# Patient Record
Sex: Male | Born: 1960 | Race: White | Hispanic: No | Smoking: Never smoker
Health system: Southern US, Community
[De-identification: ages and names within clinical notes are randomized; demographics above are authoritative.]

## PROBLEM LIST (undated history)

## (undated) DIAGNOSIS — K219 Gastro-esophageal reflux disease without esophagitis: Secondary | ICD-10-CM

## (undated) DIAGNOSIS — T7840XA Allergy, unspecified, initial encounter: Secondary | ICD-10-CM

## (undated) DIAGNOSIS — K5792 Diverticulitis of intestine, part unspecified, without perforation or abscess without bleeding: Secondary | ICD-10-CM

## (undated) DIAGNOSIS — B019 Varicella without complication: Secondary | ICD-10-CM

## (undated) HISTORY — DX: Allergy, unspecified, initial encounter: T78.40XA

## (undated) HISTORY — DX: Gastro-esophageal reflux disease without esophagitis: K21.9

## (undated) HISTORY — DX: Diverticulitis of intestine, part unspecified, without perforation or abscess without bleeding: K57.92

## (undated) HISTORY — DX: Varicella without complication: B01.9

---
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
## 2016-10-24 ENCOUNTER — Emergency Department (HOSPITAL_BASED_OUTPATIENT_CLINIC_OR_DEPARTMENT_OTHER): Payer: BLUE CROSS/BLUE SHIELD

## 2016-10-24 ENCOUNTER — Emergency Department (HOSPITAL_BASED_OUTPATIENT_CLINIC_OR_DEPARTMENT_OTHER)
Admission: EM | Admit: 2016-10-24 | Discharge: 2016-10-24 | Disposition: A | Discharge status: Home / Self Care | Payer: BLUE CROSS/BLUE SHIELD | Attending: Emergency Medicine | Admitting: Emergency Medicine

## 2016-10-24 ENCOUNTER — Encounter (HOSPITAL_BASED_OUTPATIENT_CLINIC_OR_DEPARTMENT_OTHER): Payer: Self-pay | Admitting: Emergency Medicine

## 2016-10-24 DIAGNOSIS — B349 Viral infection, unspecified: Secondary | ICD-10-CM | POA: Insufficient documentation

## 2016-10-24 DIAGNOSIS — Z23 Encounter for immunization: Secondary | ICD-10-CM | POA: Diagnosis not present

## 2016-10-24 DIAGNOSIS — J40 Bronchitis, not specified as acute or chronic: Secondary | ICD-10-CM | POA: Diagnosis not present

## 2016-10-24 DIAGNOSIS — R55 Syncope and collapse: Secondary | ICD-10-CM

## 2016-10-24 DIAGNOSIS — J208 Acute bronchitis due to other specified organisms: Secondary | ICD-10-CM

## 2016-10-24 LAB — URINALYSIS, ROUTINE W REFLEX MICROSCOPIC
BILIRUBIN URINE: NEGATIVE
Glucose, UA: NEGATIVE mg/dL
HGB URINE DIPSTICK: NEGATIVE
KETONES UR: 15 mg/dL — AB
Leukocytes, UA: NEGATIVE
NITRITE: NEGATIVE
PROTEIN: NEGATIVE mg/dL
Specific Gravity, Urine: 1.025 (ref 1.005–1.030)
pH: 6 (ref 5.0–8.0)

## 2016-10-24 LAB — BASIC METABOLIC PANEL
Anion gap: 9 (ref 5–15)
BUN: 17 mg/dL (ref 6–20)
CALCIUM: 9.3 mg/dL (ref 8.9–10.3)
CO2: 26 mmol/L (ref 22–32)
CREATININE: 1.08 mg/dL (ref 0.61–1.24)
Chloride: 101 mmol/L (ref 101–111)
GFR calc Af Amer: >60 mL/min (ref >60)
GLUCOSE: 107 mg/dL — AB (ref 65–99)
Potassium: 4.3 mmol/L (ref 3.5–5.1)
SODIUM: 136 mmol/L (ref 135–145)

## 2016-10-24 LAB — CBC
HCT: 48.5 % (ref 39.0–52.0)
Hemoglobin: 16.5 g/dL (ref 13.0–17.0)
MCH: 30.7 pg (ref 26.0–34.0)
MCHC: 34 g/dL (ref 30.0–36.0)
MCV: 90.3 fL (ref 78.0–100.0)
PLATELETS: 160 10*3/uL (ref 150–400)
RBC: 5.37 MIL/uL (ref 4.22–5.81)
RDW: 12.9 % (ref 11.5–15.5)
WBC: 7 10*3/uL (ref 4.0–10.5)

## 2016-10-24 LAB — CBG MONITORING, ED: GLUCOSE-CAPILLARY: 96 mg/dL (ref 65–99)

## 2016-10-24 LAB — TROPONIN I

## 2016-10-24 MED ORDER — SODIUM CHLORIDE 0.9 % IV BOLUS (SEPSIS)
1000.0000 mL | Freq: Once | INTRAVENOUS | Status: AC
  Administered 2016-10-24: 1000 mL via INTRAVENOUS

## 2016-10-24 MED ORDER — TETANUS-DIPHTH-ACELL PERTUSSIS 5-2.5-18.5 LF-MCG/0.5 IM SUSP
0.5000 mL | Freq: Once | INTRAMUSCULAR | Status: AC
  Administered 2016-10-24: 0.5 mL via INTRAMUSCULAR
  Filled 2016-10-24: qty 0.5

## 2016-10-24 NOTE — ED Notes (Signed)
Delay in xray, RN starting IV 

## 2016-10-24 NOTE — ED Triage Notes (Signed)
Patient states that he has had generalized muscle aches and a cough x 1 week. He went to the urgent care and while waiting he passed out face first. He now would like to be checked for the cough / cold, head injury and nasal bridge injury

## 2016-10-24 NOTE — ED Provider Notes (Signed)
[facility] Provider Note   CSN: [id number] Arrival date & time: 10/24/16  1222     History   Chief Complaint Chief Complaint  Patient presents with  . Loss of Consciousness    HPI John Ryan is a 56 y.o. male.  HPI 56 year old male who presents with syncopal episode. He reports 3-4 days of cough, congestion, generalized body ache.Has had low-grade temperatures but no chills or night sweats. Denies any nausea, vomiting, diarrhea, dysuria or urinary frequency. Today went to urgent care for evaluation. States that he tried to get up to use the restroom, feeling very dizzy and nauseated. While ambulating towards the bathroom he had loss of consciousness. Denies any associating chest pain or shortness of breath, confusion, focal numbness or weakness vision or speech changes. He is not anticoagulated.   History reviewed. No pertinent past medical history.  There are no active problems to display for this patient.   History reviewed. No pertinent surgical history.     Home Medications    Prior to Admission medications   Not on File    Family History History reviewed. No pertinent family history.  Social History Social History  Substance Use Topics  . Smoking status: Never Smoker  . Smokeless tobacco: Never Used  . Alcohol use Yes     Comment: occ     Allergies   Patient has no known allergies.   Review of Systems Review of Systems  Constitutional: Positive for fatigue. Negative for fever.  Respiratory: Positive for cough. Negative for shortness of breath.   Cardiovascular: Negative for chest pain.  Gastrointestinal: Negative for abdominal pain, nausea and vomiting.  Genitourinary: Negative for dysuria.  Neurological: Positive for syncope and light-headedness.  All other systems reviewed and are negative.    Physical Exam Updated Vital Signs BP (!) 147/84 (BP Location: Right Arm)   Pulse 94   Temp 99.1 F (37.3 C) (Oral)   Resp 18   Ht   (1.803 m)   Wt 86.2 kg (190 lb)   SpO2 98%   BMI 26.50 kg/m   Physical Exam Physical Exam  Nursing note and vitals reviewed. Constitutional: Well developed, well nourished, non-toxic, and in no acute distress Head: Normocephalic and abrasions over nose and fore head. No deformity.  Mouth/Throat: Oropharynx is clear and moist.  Neck: Normal range of motion. Neck supple.  Cardiovascular: Normal rate and regular rhythm.   Eyes: PERRL, EOMI Ears: bilateral TMs clear and normal Pulmonary/Chest: Effort normal and breath sounds normal.  Abdominal: Soft. There is no tenderness. There is no rebound and no guarding.  Musculoskeletal: Normal range of motion.  Skin: Skin is warm and dry.  Psychiatric: Cooperative Neurological:  Alert, oriented to person, place, time, and situation. Memory grossly in tact. Fluent speech. No dysarthria or aphasia.  Cranial nerves: VF are full.  EOMI without nystagmus. No gaze deviation. Facial muscles symmetric with activation. Sensation to light touch over face in tact bilaterally. Hearing grossly in tact. Palate elevates symmetrically. Head turn and shoulder shrug are intact. Tongue midline.  Reflexes defered.  Muscle bulk and tone normal. No pronator drift. Moves all extremities symmetrically. Sensation to light touch is in tact throughout in bilateral upper and lower extremities. Coordination reveals no dysmetria with finger to nose. Gait is narrow-based and steady. Non-ataxic.    ED Treatments / Results  Labs (all labs ordered are listed, but only abnormal results are displayed) Labs Reviewed  BASIC METABOLIC PANEL - Abnormal; Notable for the following:  Result Value   Glucose, Bld 107 (*)    All other components within normal limits  URINALYSIS, ROUTINE W REFLEX MICROSCOPIC - Abnormal; Notable for the following:    Ketones, ur 15 (*)    All other components within normal limits  CBC  TROPONIN I  CBG MONITORING, ED    EKG  EKG  Interpretation  Date/Time:   year old male, otherwise healthy, who presents with episode of syncope in the setting of a viral syndrome. He is afebrile, hemodynamically stable. He looks dry on exam. Multiple abrasions over the face, but clinically without evidence of severe head injury. Mentation is normal, neurological exam is normal. No signs of ecchymosis or skull fracture.  Is given IV fluids, and feels improved. Blood work is reassuring. No major signs of electrolyte or metabolic derangements are acute kidney injury. EKG is nonischemic and without stigmata of arrhythmia. I feel underlying illness and  possible dehydration.Chest x-ray visualized and shows evidence of a bronchitis but no obvious infiltrate to suggest pneumonia.  The patient is stable for outpatient management. He will continue supportive care management for hisviral bronchitis. Strict return and follow-up instructions reviewed. He expressed understanding of all discharge instructions and felt comfortable with the plan of care.   Final Clinical Impressions(s) / ED Diagnoses   Final diagnoses:  Syncope and collapse  Viral bronchitis    New Prescriptions New Prescriptions   No medications on file     Lavera Guise, MD 10/24/16 1546

## 2016-10-24 NOTE — Discharge Instructions (Signed)
You have a viral respiratory illness and dehydration that likely caused you to pass out  Please get rest. Drink plenty of fluids.   Your symptoms may be 1-2 weeks to get fully better.  Return for worsening symptoms, including persistent fevers, confusion, difficulty breathing, or any other symptoms concerning to you.

## 2018-04-13 IMAGING — DX DG CHEST 2V
2 series · 2 of 2 positions shown · non-contrast
Comparison: 03/09/2015

CLINICAL DATA: Productive cough and low grade temperature

EXAM:
CHEST  2 VIEW

[chest lat]
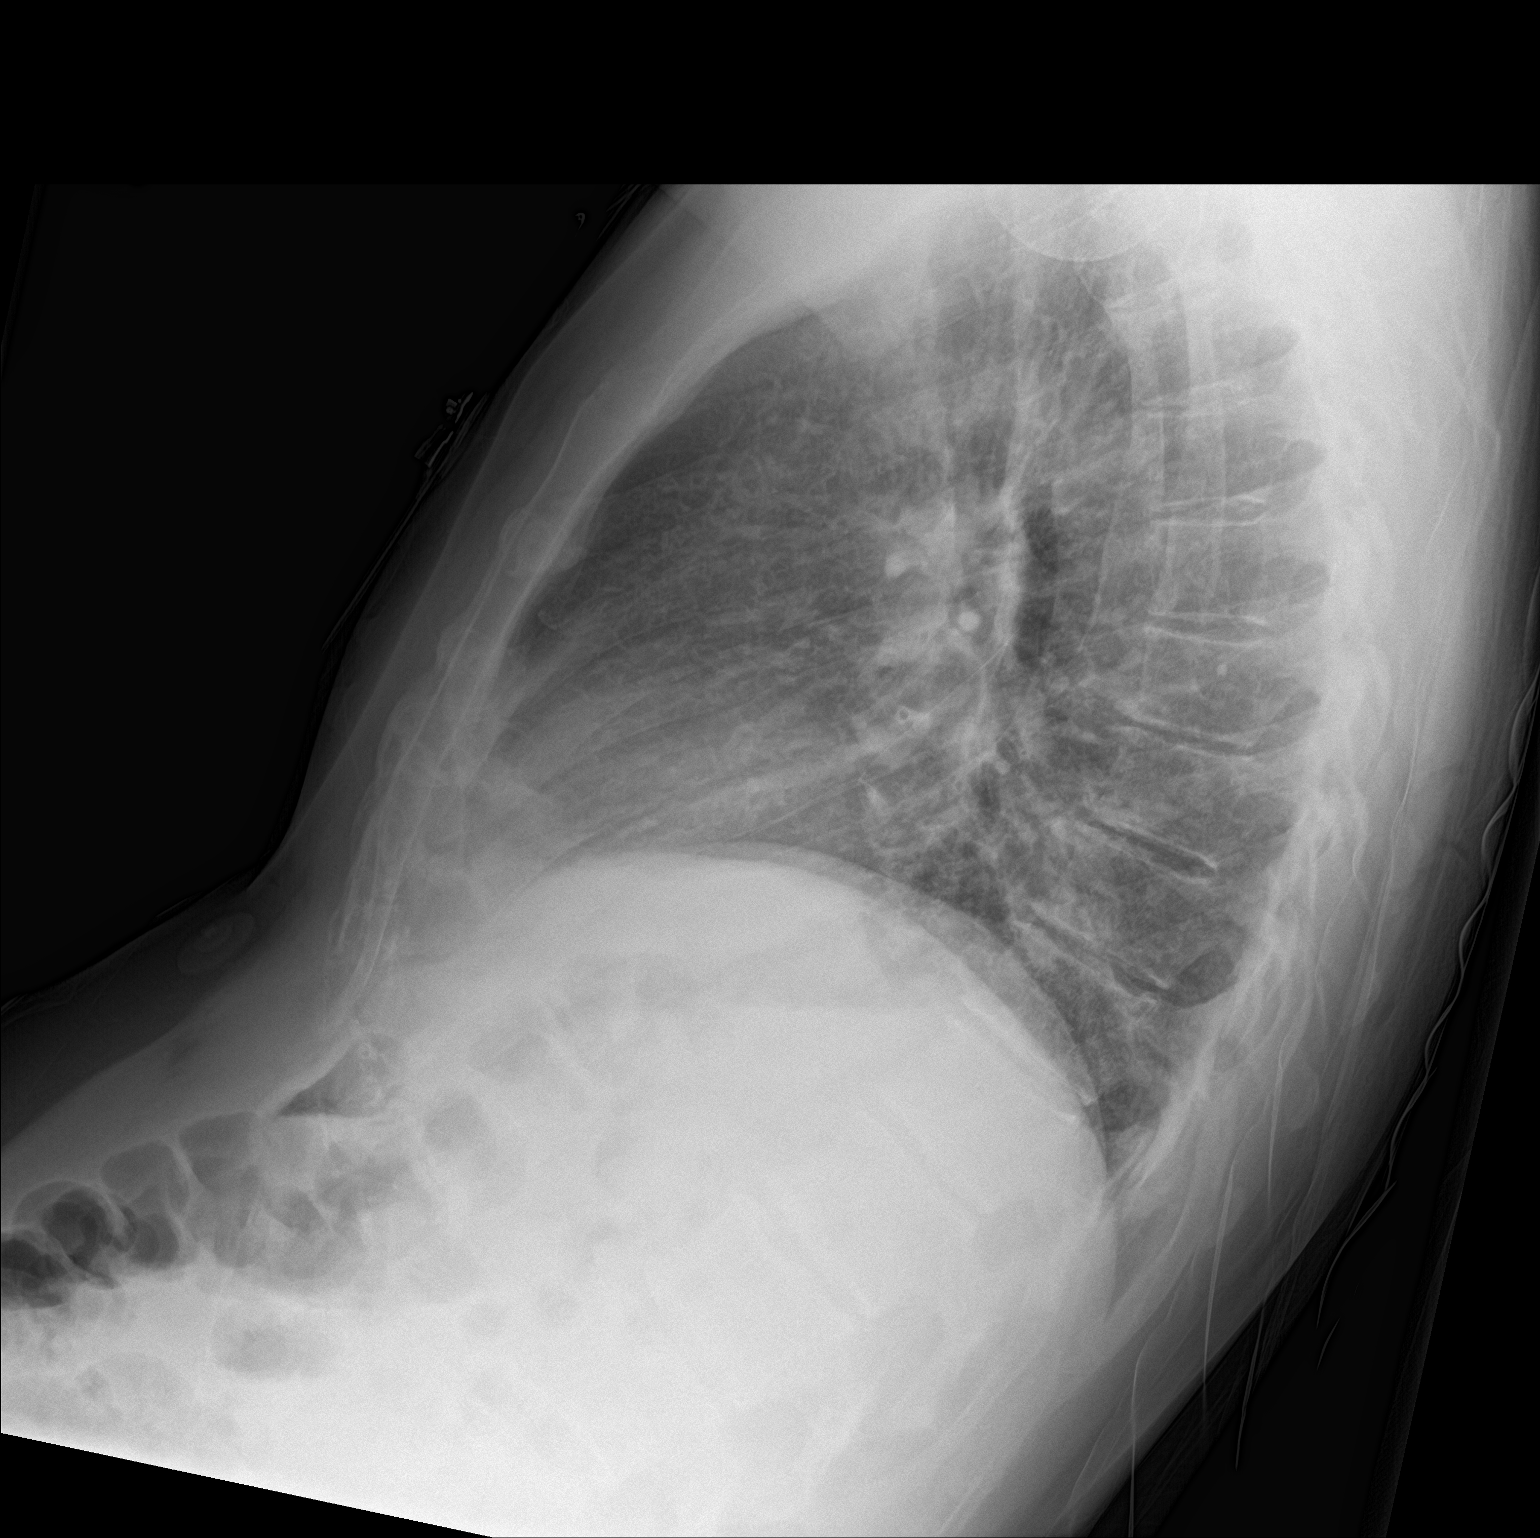

[chest ap strecther]
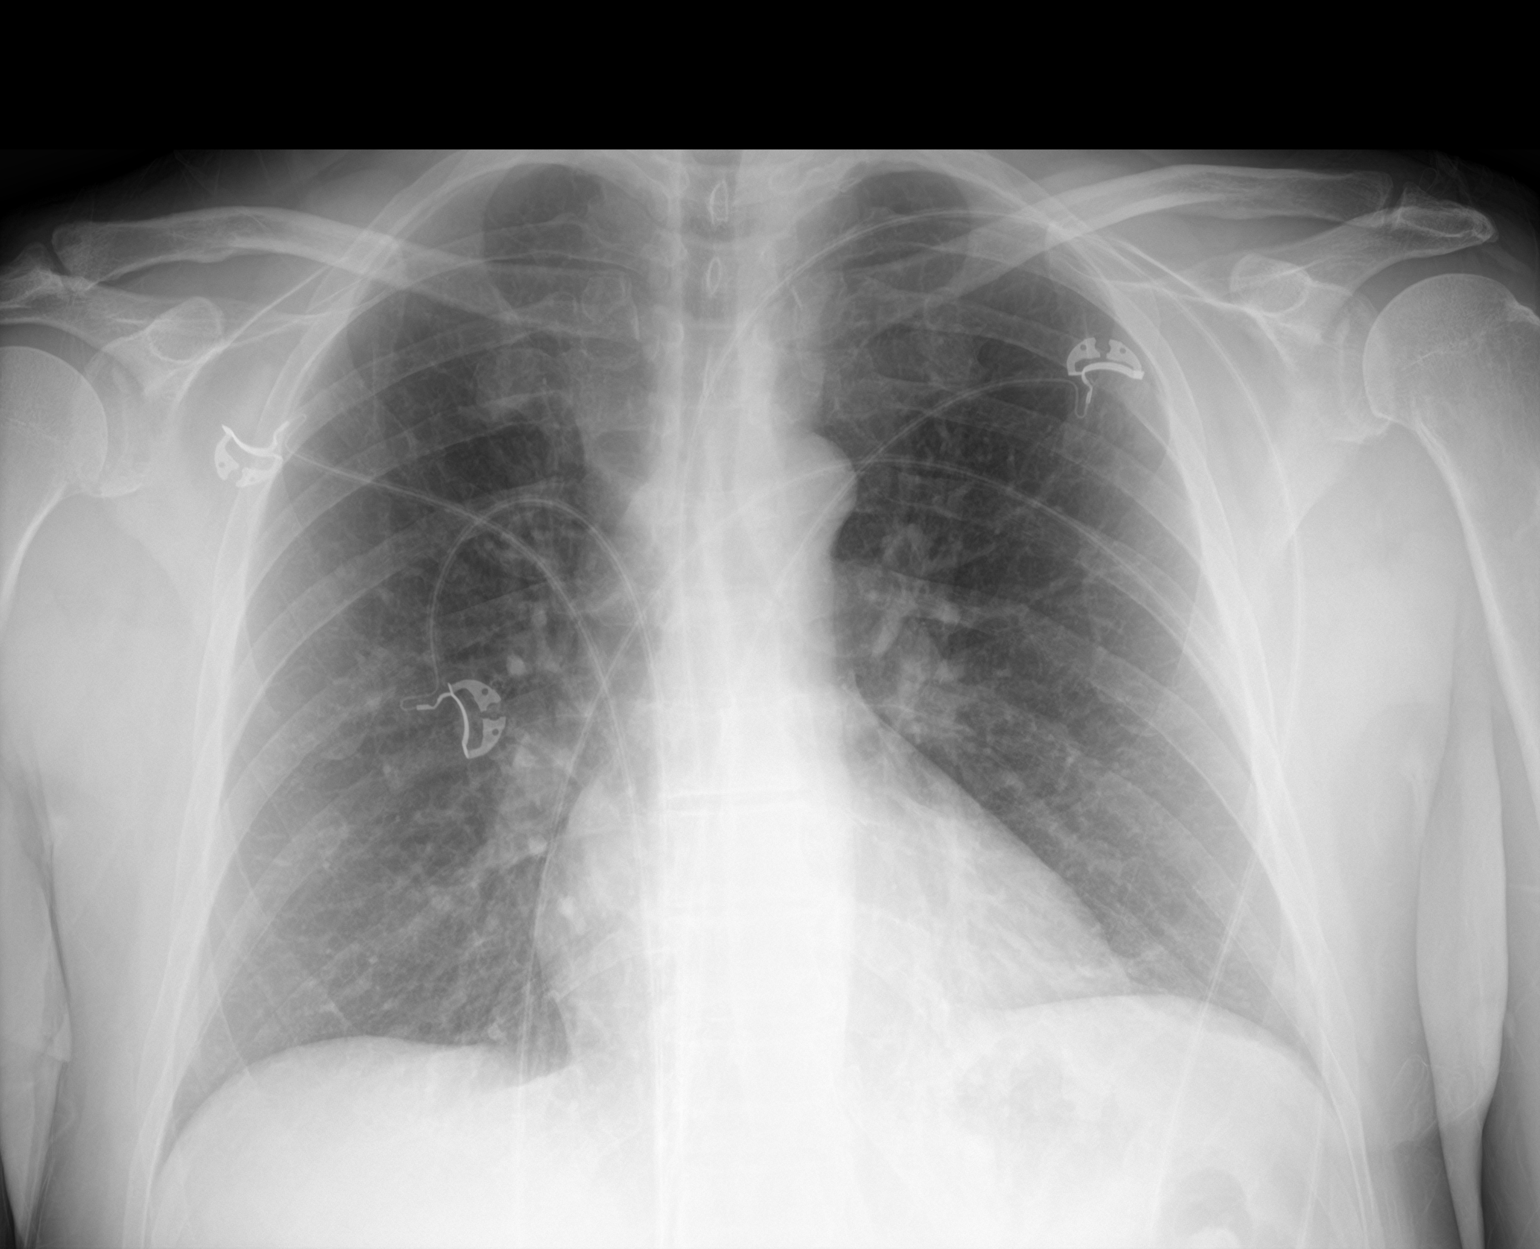

[2 of 2 positions shown; findings below may reference images not displayed]

FINDINGS: Normal cardiac silhouette. Mild central peribronchial cuffing. No
focal consolidation. No pleural fluid. No pneumothorax. No acute
osseous abnormality.
IMPRESSION: Peribronchial cuffing suggests bronchitis or bronchiolitis. No
evidence pneumonia.

## 2019-07-04 ENCOUNTER — Telehealth: Payer: Self-pay | Admitting: General Practice

## 2019-07-04 NOTE — Telephone Encounter (Signed)
Okay to schedule NP appt at his convenience please.

## 2019-07-04 NOTE — Telephone Encounter (Signed)
Please advise 

## 2019-07-04 NOTE — Telephone Encounter (Signed)
Patient's Wife is calling to see if her husband can become a new patient of Dr. Patsy Lager. Please advise [name],[name] [phone]

## 2019-07-04 NOTE — Telephone Encounter (Signed)
Ok, will see him  

## 2019-08-05 NOTE — Progress Notes (Addendum)
[facility] [address] [place], [place] [address] [phone]  Date:  08/06/2019   Name:  John Ryan   DOB:  [birth year]   MRN:  [id number]  PCP:  Pearline Cables, MD    Chief Complaint: New Patient (Initial Visit) (annual exam)   History of Present Illness:  John Ryan is a 59 y.o. 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   Past Medical History:  Diagnosis Date  . Allergy   . Chickenpox   . Diverticulitis   . GERD (gastroesophageal reflux disease)     Past Surgical History:  Procedure Laterality Date  . TONSILLECTOMY AND ADENOIDECTOMY  1965    Social History   Tobacco Use  . Smoking status: Never Smoker  . Smokeless tobacco: Never Used  Substance Use Topics  . Alcohol use: Yes    Comment: occ  . Drug use: Not Currently    Types: Marijuana    Comment: every day     Family History  Problem Relation Age of Onset  . Arthritis Mother   . Cancer Mother   . Early death Mother   . Stroke Father   . Depression Paternal Grandmother   . Early death Paternal Grandmother   . Heart  attack Paternal Grandmother   . Hearing loss Paternal Grandfather   . Heart attack Paternal Grandfather     No Known Allergies  Medication list has been reviewed and updated.  Current Outpatient Medications on File Prior to Visit  Medication Sig Dispense Refill  . cetirizine (ZYRTEC) 10 MG tablet Take 10 mg by mouth daily.    . valACYclovir (VALTREX) 1000 MG tablet Take 1,000 mg by mouth daily.     No current facility-administered medications on file prior to visit.    Review of Systems:  As per HPI- otherwise negative.   Physical Examination: Vitals:   08/06/19 1344  BP: 112/80  Pulse: 77  Resp: 17  SpO2: 98%   Vitals:   08/06/19 1344  Weight: 182 lb (82.6 kg)  Height: 5\' 11"  (1.803 m)   Body mass index is 25.38 kg/m. Ideal Body Weight: Weight in (lb) to have BMI = 25: 178.9  GEN: no acute distress. Normal weight, looks well HEENT: Atraumatic, Normocephalic.   Bilateral TM wnl, oropharynx normal.  PEERL,EOMI.   Ears and Nose: No external deformity. CV: RRR, No M/G/R. No JVD. No thrill. No extra heart sounds. PULM: CTA B, no wheezes, crackles, rhonchi. No retractions.  No resp. distress. No accessory muscle use. ABD: S, NT, ND. No rebound. No HSM. EXTR: No c/c/e PSYCH: Normally interactive. Conversant.    Assessment and Plan: Immunization due - Plan: Varicella-zoster vaccine IM (Shingrix)  Encounter for screening colonoscopy  Screening for diabetes mellitus - Plan: Comprehensive metabolic panel, Hemoglobin A1c  Screening for malignant neoplasm of prostate - Plan: PSA  Screening for hyperlipidemia - Plan: Lipid panel  Screening, deficiency anemia, iron - Plan: CBC  Here today for an establish care visit Patient is generally healthy, has not had much in the way of health maintenance We will do routine labs today, give first dose of Shingrix Ordered Cologuard to screen for colon cancer Will plan further follow- up pending labs.  This visit occurred  during the SARS-CoV-2 public health emergency.  Safety protocols were in place, including screening questions prior to the visit, additional usage of staff PPE, and extensive cleaning of exam room while observing appropriate contact time as indicated for disinfecting solutions.    Signed Abbe Amsterdam, MD  Received his labs 7/27- message to pt  Results for orders placed or performed in visit on 08/06/19  CBC  Result Value Ref Range   WBC 5.7 4.0 - 10.5 K/uL   RBC 4.23 4.22 - 5.81 Mil/uL   Platelets 176.0 150 - 400 K/uL   Hemoglobin 14.0 13.0 - 17.0 g/dL   HCT 46.2 39 - 52 %   MCV 95.6 78.0 - 100.0 fl   MCHC 34.6 30.0 - 36.0 g/dL   RDW 70.3 50.0 - 93.8 %  Comprehensive metabolic panel  Result Value Ref Range   Sodium 139 135 - 145 mEq/L   Potassium 3.6 3.5 - 5.1 mEq/L   Chloride 106 96 - 112 mEq/L   CO2 26 19 - 32 mEq/L   Glucose, Bld 99 70 - 99 mg/dL   BUN 19 6 - 23 mg/dL   Creatinine, Ser 1.82 0.40 - 1.50 mg/dL   Total Bilirubin 0.7 0.2 - 1.2 mg/dL   Alkaline Phosphatase 53 39 - 117 U/L   AST 15 0 - 37 U/L   ALT 15 0 - 53 U/L   Total Protein 6.4 6.0 - 8.3 g/dL   Albumin 4.2 3.5 - 5.2 g/dL   GFR 99.37 >16.96 mL/min   Calcium 9.0 8.4 - 10.5 mg/dL  Hemoglobin V8L  Result Value Ref Range   Hgb A1c MFr Bld 5.6 4.6 - 6.5 %  Lipid panel  Result Value Ref Range   Cholesterol 164 0 - 200 mg/dL   Triglycerides 38.1 0 - 149 mg/dL   HDL 01.75 >10.25 mg/dL   VLDL 85.2 0.0 - 77.8 mg/dL   LDL Cholesterol 242 (H) 0 - 99 mg/dL   Total CHOL/HDL Ratio 3    NonHDL 115.97   PSA  Result Value Ref Range   PSA 0.68 0.10 - 4.00 ng/mL

## 2019-08-05 NOTE — Patient Instructions (Addendum)
It was great to meet you today!  I will be in touch with your labs We will order cologuard to be delivered to your home- please complete this kit asap! You got your first dose of shingles vaccine today- 2nd dose in 2-6 months   

## 2019-08-06 ENCOUNTER — Ambulatory Visit: Payer: BLUE CROSS/BLUE SHIELD | Admitting: Family Medicine

## 2019-08-06 ENCOUNTER — Encounter: Payer: Self-pay | Admitting: Family Medicine

## 2019-08-06 ENCOUNTER — Other Ambulatory Visit: Payer: Self-pay

## 2019-08-06 VITALS — BP 112/80 | HR 77 | Resp 17 | Ht 71.0 in | Wt 182.0 lb

## 2019-08-06 DIAGNOSIS — Z1211 Encounter for screening for malignant neoplasm of colon: Secondary | ICD-10-CM

## 2019-08-06 DIAGNOSIS — Z13220 Encounter for screening for lipoid disorders: Secondary | ICD-10-CM | POA: Diagnosis not present

## 2019-08-06 DIAGNOSIS — Z130 Encounter for screening for diseases of the blood and blood-forming organs and certain disorders involving the immune mechanism: Secondary | ICD-10-CM | POA: Diagnosis not present

## 2019-08-06 DIAGNOSIS — Z131 Encounter for screening for diabetes mellitus: Secondary | ICD-10-CM | POA: Diagnosis not present

## 2019-08-06 DIAGNOSIS — Z23 Encounter for immunization: Secondary | ICD-10-CM | POA: Diagnosis not present

## 2019-08-06 DIAGNOSIS — Z125 Encounter for screening for malignant neoplasm of prostate: Secondary | ICD-10-CM | POA: Diagnosis not present

## 2019-08-06 DIAGNOSIS — Z0000 Encounter for general adult medical examination without abnormal findings: Secondary | ICD-10-CM | POA: Diagnosis not present

## 2019-08-07 ENCOUNTER — Encounter: Payer: Self-pay | Admitting: Family Medicine

## 2019-08-07 LAB — CBC
HCT: 40.5 % (ref 39.0–52.0)
Hemoglobin: 14 g/dL (ref 13.0–17.0)
MCHC: 34.6 g/dL (ref 30.0–36.0)
MCV: 95.6 fl (ref 78.0–100.0)
Platelets: 176 10*3/uL (ref 150.0–400.0)
RBC: 4.23 Mil/uL (ref 4.22–5.81)
RDW: 13.1 % (ref 11.5–15.5)
WBC: 5.7 10*3/uL (ref 4.0–10.5)

## 2019-08-07 LAB — COMPREHENSIVE METABOLIC PANEL
ALT: 15 U/L (ref 0–53)
AST: 15 U/L (ref 0–37)
Albumin: 4.2 g/dL (ref 3.5–5.2)
Alkaline Phosphatase: 53 U/L (ref 39–117)
BUN: 19 mg/dL (ref 6–23)
CO2: 26 mEq/L (ref 19–32)
Calcium: 9 mg/dL (ref 8.4–10.5)
Chloride: 106 mEq/L (ref 96–112)
Creatinine, Ser: 1.05 mg/dL (ref 0.40–1.50)
GFR: 71.8 mL/min (ref >60.00)
Glucose, Bld: 99 mg/dL (ref 70–99)
Potassium: 3.6 mEq/L (ref 3.5–5.1)
Sodium: 139 mEq/L (ref 135–145)
Total Bilirubin: 0.7 mg/dL (ref 0.2–1.2)
Total Protein: 6.4 g/dL (ref 6.0–8.3)

## 2019-08-07 LAB — LIPID PANEL
Cholesterol: 164 mg/dL (ref 0–200)
HDL: 48.4 mg/dL (ref >39.00)
LDL Cholesterol: 100 mg/dL — ABNORMAL HIGH (ref 0–99)
NonHDL: 115.97
Total CHOL/HDL Ratio: 3
Triglycerides: 82 mg/dL (ref 0.0–149.0)
VLDL: 16.4 mg/dL (ref 0.0–40.0)

## 2019-08-07 LAB — PSA: PSA: 0.68 ng/mL (ref 0.10–4.00)

## 2019-08-07 LAB — HEMOGLOBIN A1C: Hgb A1c MFr Bld: 5.6 % (ref 4.6–6.5)

## 2020-01-23 ENCOUNTER — Encounter: Payer: Self-pay | Admitting: Family Medicine

## 2020-02-07 ENCOUNTER — Ambulatory Visit: Payer: Self-pay

## 2021-07-15 DIAGNOSIS — H179 Unspecified corneal scar and opacity: Secondary | ICD-10-CM | POA: Insufficient documentation

## 2021-07-15 NOTE — Patient Instructions (Addendum)
It was good to see you again today!  I will be in touch with your lab work I recommend getting a COVID-19 booster at the pharmacy if not done already  As we discussed, you have early beats on your EKG called PVCs.  These are generally NOT harmful.  However if you develop any chest pain or tightness, or shortness of breath please let me know right away   I will also set up a Zio patch monitor for you- let me know if you don't hear about this in the next week or so

## 2021-07-15 NOTE — Progress Notes (Signed)
[place] at [facility] [address] [place], [place] [address] [phone]  Date:  07/22/2021   Name:  John Ryan   DOB:  1960-05-17   MRN:  [id number]  PCP:  Pearline Cables, MD    Chief Complaint: Annual Exam (Concerns/ questions: none/Hep C/Colon: none recent/Shingrix 2: 1 dose d/t reaction)   History of Present Illness:  [name] is a 61 y.o. 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  He would like to do cologuard which I will order for him  Needs second dose Shingrix- however pt had reaction to first dose shingles so will not give this  COVID booster Due for lab update- will update today He does not get a lot of formal exercise- trying to walk more  No CP or SOB  Patient Active Problem List   Diagnosis Date Noted   Corneal scarring 07/15/2021    Past Medical History:  Diagnosis Date   Allergy    Chickenpox    Diverticulitis    GERD (gastroesophageal reflux disease)     Past Surgical History:  Procedure Laterality Date   TONSILLECTOMY AND ADENOIDECTOMY  1965    Social History   Tobacco Use   Smoking status: Never   Smokeless tobacco: Never  Substance Use Topics   Alcohol use: Yes    Comment: occ   Drug use: Not Currently    Types: Marijuana    Comment: every day     Family History  Problem Relation Age of Onset   Arthritis Mother    Cancer Mother    Early death Mother    Stroke Father    Depression Paternal Grandmother    Early death Paternal  Grandmother    Heart attack Paternal Grandmother    Hearing loss Paternal Grandfather    Heart attack Paternal Grandfather     Allergies  Allergen Reactions   Shingrix [Zoster Vac Recomb Adjuvanted] Other (See Comments)    Bad reaction- high temp    Medication list has been reviewed and updated.  Current Outpatient Medications on File Prior to Visit  Medication Sig Dispense Refill   cetirizine (ZYRTEC) 10 MG tablet Take 10 mg by mouth daily.     valACYclovir (VALTREX) 1000 MG tablet Take 1,000 mg by mouth daily.     No current facility-administered medications on file prior to visit.    Review of Systems:  As per HPI- otherwise negative. BP Readings from Last 3 Encounters:  07/22/21 100/62  08/06/19 112/80  10/24/16 (!) 149/91     Physical Examination: Vitals:   07/22/21 1318  BP: 100/62  Pulse: 84  Resp: 18  Temp: 97.8 F (36.6 C)  SpO2: 96%   Vitals:   07/22/21 1318  Weight: 197 lb 9.6 oz (89.6 kg)  Height: 5\' 11"  (1.803 m)   Body mass index is 27.56 kg/m. Ideal Body Weight: Weight in (lb) to have BMI = 25: 178.9  GEN: no acute distress.  Minimal overweight, looks well  HEENT: Atraumatic, Normocephalic.  Ears and Nose: No external deformity. CV: Noted pulse irregularity, No M/G/R. No JVD. No thrill. No extra heart sounds. PULM: CTA B, no wheezes, crackles, rhonchi. No retractions. No resp. distress. No accessory muscle use. ABD: S, NT, ND, +BS. No rebound. No HSM. EXTR: No c/c/e PSYCH: Normally interactive. Conversant.   EKG: SR with frequent PVCs sometimes bigemeny  Compared with tracing in 2018, no change x PVCs  Assessment and Plan: Physical exam  Corneal scarring  Screening for diabetes mellitus - Plan: Comprehensive metabolic panel, Hemoglobin A1c  Screening, deficiency anemia, iron - Plan: CBC  Screening for hyperlipidemia - Plan: Lipid panel  Screening for malignant neoplasm of prostate - Plan: PSA  Encounter for hepatitis C  screening test for low risk patient - Plan: Hepatitis C antibody  Screening for colon cancer - Plan: Cologuard  Irregularly irregular pulse rhythm - Plan: EKG 12-Lead, TSH, CANCELED: LONG TERM MONITOR (3-14 DAYS)  Physical exam today.  Encouraged healthy diet and exercise routine Will plan further follow- up pending labs. Order cologaurd for him  Noted frequent PVCs, sometimes bigeminy.  Patient is asymptomatic.  We will order a Zio patch to determine PVC burden Signed Abbe Amsterdam, MD  Addendum 7/13, received labs as below.  Message to patient  Results for orders placed or performed in visit on 07/22/21  CBC  Result Value Ref Range   WBC 5.7 4.0 - 10.5 K/uL   RBC 4.37 4.22 - 5.81 Mil/uL   Platelets 198.0 150.0 - 400.0 K/uL   Hemoglobin 14.4 13.0 - 17.0 g/dL   HCT 81.4 48.1 - 85.6 %   MCV 96.7 78.0 - 100.0 fl   MCHC 34.0 30.0 - 36.0 g/dL   RDW 31.4 97.0 - 26.3 %  Comprehensive metabolic panel  Result Value Ref Range   Sodium 140 135 - 145 mEq/L   Potassium 4.3 3.5 - 5.1 mEq/L   Chloride 105 96 - 112 mEq/L   CO2 29 19 - 32 mEq/L   Glucose, Bld 103 (H) 70 - 99 mg/dL   BUN 25 (H) 6 - 23 mg/dL   Creatinine, Ser 7.85 0.40 - 1.50 mg/dL   Total Bilirubin 0.9 0.2 - 1.2 mg/dL   Alkaline Phosphatase 51 39 - 117 U/L   AST 21 0 - 37 U/L   ALT 18 0 - 53 U/L   Total Protein 6.9 6.0 - 8.3 g/dL   Albumin 4.4 3.5 - 5.2 g/dL   GFR 88.50 >27.74 mL/min   Calcium 9.3 8.4 - 10.5 mg/dL  Hemoglobin J2I  Result Value Ref Range   Hgb A1c MFr Bld 5.9 4.6 - 6.5 %  Lipid panel  Result Value Ref Range   Cholesterol 171 0 - 200 mg/dL   Triglycerides 78.6 0.0 - 149.0 mg/dL   HDL 76.72 >09.47 mg/dL   VLDL 09.6 0.0 - 28.3 mg/dL   LDL Cholesterol 662 (H) 0 - 99 mg/dL   Total CHOL/HDL Ratio 3    NonHDL 115.64   PSA  Result Value Ref Range   PSA 0.57 0.10 - 4.00 ng/mL  TSH  Result Value Ref Range   TSH 0.90 0.35 - 5.50 uIU/mL

## 2021-07-22 ENCOUNTER — Ambulatory Visit (INDEPENDENT_AMBULATORY_CARE_PROVIDER_SITE_OTHER): Payer: No Typology Code available for payment source

## 2021-07-22 ENCOUNTER — Ambulatory Visit (INDEPENDENT_AMBULATORY_CARE_PROVIDER_SITE_OTHER): Payer: No Typology Code available for payment source | Admitting: Family Medicine

## 2021-07-22 ENCOUNTER — Other Ambulatory Visit: Payer: Self-pay | Admitting: Family Medicine

## 2021-07-22 VITALS — BP 100/62 | HR 84 | Temp 97.8°F | Resp 18 | Ht 71.0 in | Wt 197.6 lb

## 2021-07-22 DIAGNOSIS — Z13220 Encounter for screening for lipoid disorders: Secondary | ICD-10-CM

## 2021-07-22 DIAGNOSIS — Z131 Encounter for screening for diabetes mellitus: Secondary | ICD-10-CM | POA: Diagnosis not present

## 2021-07-22 DIAGNOSIS — I499 Cardiac arrhythmia, unspecified: Secondary | ICD-10-CM

## 2021-07-22 DIAGNOSIS — Z0000 Encounter for general adult medical examination without abnormal findings: Secondary | ICD-10-CM | POA: Diagnosis not present

## 2021-07-22 DIAGNOSIS — H179 Unspecified corneal scar and opacity: Secondary | ICD-10-CM

## 2021-07-22 DIAGNOSIS — Z130 Encounter for screening for diseases of the blood and blood-forming organs and certain disorders involving the immune mechanism: Secondary | ICD-10-CM | POA: Diagnosis not present

## 2021-07-22 DIAGNOSIS — Z114 Encounter for screening for human immunodeficiency virus [HIV]: Secondary | ICD-10-CM

## 2021-07-22 DIAGNOSIS — I493 Ventricular premature depolarization: Secondary | ICD-10-CM

## 2021-07-22 DIAGNOSIS — R7303 Prediabetes: Secondary | ICD-10-CM

## 2021-07-22 DIAGNOSIS — Z125 Encounter for screening for malignant neoplasm of prostate: Secondary | ICD-10-CM | POA: Diagnosis not present

## 2021-07-22 DIAGNOSIS — Z1211 Encounter for screening for malignant neoplasm of colon: Secondary | ICD-10-CM

## 2021-07-22 DIAGNOSIS — Z1159 Encounter for screening for other viral diseases: Secondary | ICD-10-CM

## 2021-07-22 NOTE — Progress Notes (Unsigned)
Enrolled for [employer] to mail a ZIO XT long term holter monitor to the patients address on file.   DOD to read. 

## 2021-07-23 ENCOUNTER — Encounter: Payer: Self-pay | Admitting: Family Medicine

## 2021-07-23 DIAGNOSIS — R7303 Prediabetes: Secondary | ICD-10-CM | POA: Insufficient documentation

## 2021-07-23 LAB — CBC
HCT: 42.3 % (ref 39.0–52.0)
Hemoglobin: 14.4 g/dL (ref 13.0–17.0)
MCHC: 34 g/dL (ref 30.0–36.0)
MCV: 96.7 fl (ref 78.0–100.0)
Platelets: 198 10*3/uL (ref 150.0–400.0)
RBC: 4.37 Mil/uL (ref 4.22–5.81)
RDW: 13 % (ref 11.5–15.5)
WBC: 5.7 10*3/uL (ref 4.0–10.5)

## 2021-07-23 LAB — COMPREHENSIVE METABOLIC PANEL
ALT: 18 U/L (ref 0–53)
AST: 21 U/L (ref 0–37)
Albumin: 4.4 g/dL (ref 3.5–5.2)
Alkaline Phosphatase: 51 U/L (ref 39–117)
BUN: 25 mg/dL — ABNORMAL HIGH (ref 6–23)
CO2: 29 mEq/L (ref 19–32)
Calcium: 9.3 mg/dL (ref 8.4–10.5)
Chloride: 105 mEq/L (ref 96–112)
Creatinine, Ser: 1.23 mg/dL (ref 0.40–1.50)
GFR: 62.72 mL/min (ref >60.00)
Glucose, Bld: 103 mg/dL — ABNORMAL HIGH (ref 70–99)
Potassium: 4.3 mEq/L (ref 3.5–5.1)
Sodium: 140 mEq/L (ref 135–145)
Total Bilirubin: 0.9 mg/dL (ref 0.2–1.2)
Total Protein: 6.9 g/dL (ref 6.0–8.3)

## 2021-07-23 LAB — LIPID PANEL
Cholesterol: 171 mg/dL (ref 0–200)
HDL: 55 mg/dL (ref >39.00)
LDL Cholesterol: 103 mg/dL — ABNORMAL HIGH (ref 0–99)
NonHDL: 115.64
Total CHOL/HDL Ratio: 3
Triglycerides: 61 mg/dL (ref 0.0–149.0)
VLDL: 12.2 mg/dL (ref 0.0–40.0)

## 2021-07-23 LAB — HEPATITIS C ANTIBODY: Hepatitis C Ab: NONREACTIVE

## 2021-07-23 LAB — PSA: PSA: 0.57 ng/mL (ref 0.10–4.00)

## 2021-07-23 LAB — TSH: TSH: 0.9 u[IU]/mL (ref 0.35–5.50)

## 2021-07-23 LAB — HEMOGLOBIN A1C: Hgb A1c MFr Bld: 5.9 % (ref 4.6–6.5)

## 2021-07-25 DIAGNOSIS — I493 Ventricular premature depolarization: Secondary | ICD-10-CM | POA: Diagnosis not present

## 2021-07-25 DIAGNOSIS — I499 Cardiac arrhythmia, unspecified: Secondary | ICD-10-CM | POA: Diagnosis not present

## 2021-08-10 ENCOUNTER — Encounter: Payer: Self-pay | Admitting: Family Medicine

## 2021-08-10 DIAGNOSIS — I493 Ventricular premature depolarization: Secondary | ICD-10-CM

## 2021-08-20 ENCOUNTER — Encounter: Payer: Self-pay | Admitting: Family Medicine

## 2021-08-20 LAB — COLOGUARD: COLOGUARD: NEGATIVE

## 2021-08-21 ENCOUNTER — Ambulatory Visit: Payer: No Typology Code available for payment source | Admitting: Cardiology

## 2021-08-21 ENCOUNTER — Encounter: Payer: Self-pay | Admitting: Cardiology

## 2021-08-21 VITALS — BP 110/58 | HR 82 | Ht 71.0 in | Wt 197.0 lb

## 2021-08-21 DIAGNOSIS — I493 Ventricular premature depolarization: Secondary | ICD-10-CM

## 2021-08-21 MED ORDER — METOPROLOL SUCCINATE ER 25 MG PO TB24: 12.5000 mg | ORAL_TABLET | Freq: Every day | ORAL | 3 refills | Status: AC

## 2021-08-21 NOTE — Progress Notes (Signed)
Cardiology Office Note:    Date:  08/21/2021   ID:  John Ryan, DOB 1960-08-08, MRN [id number]  PCP:  Pearline Cables, MD  Cardiologist:  Norman Herrlich, MD   Referring MD: Pearline Cables, MD  ASSESSMENT:    1. PVC's (premature ventricular contractions)   2. 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  He is asymptomatic except for remote episode of syncope years ago I [place] start him on a low-dose beta-blocker and check a magnesium for completeness in the office.  He will discontinue his antihistamine which is potentially proarrhythmic.  Next appointment 2 months   Medication Adjustments/Labs and Tests Ordered: Current medicines are reviewed at length with the patient today.  Concerns regarding medicines are outlined above.  Orders Placed This Encounter  Procedures   Magnesium   ECHOCARDIOGRAM COMPLETE   Meds ordered this encounter  Medications   metoprolol succinate (TOPROL XL) 25 MG 24 hr tablet    Sig: Take 0.5 tablets (12.5 mg total) by mouth daily.    Dispense:  45 tablet    Refill:  3     Chief Complaint  Patient presents with   PVC's    History of Present Illness:    John Ryan is a 61 y.o. male who is being seen today for the evaluation of PVCs at the request of Copland, Gwenlyn Found, MD.  An event monitor reported 08/10/2021 showing a PVC burden of 11% episodes of nonsustained ventricular tachycardia longest episode 13 seconds.  EKG 07/22/2021 showed uniform morphology frequent PVCs left bundle branch block morphology otherwise normal including QRS morphology T waves QT interval  He had irregular pulse during his physical exam leading to EKG  leading to event monitor.  I cannot see his old EKGs but he tells me in 2018 he had syncope and had PVCs at that time  He has no known heart disease congenital rheumatic or atrial fibrillation He has no exercise intolerance edema shortness of breath palpitation or recurrent syncope but notices more fatigue than a year ago. He had COVID-19 infection twice initially February 21 subsequently January 2022 with good recovery did not require hospitalization He has no family history of cardiomyopathy or sudden cardiac death of paternal grandfather had a heart attack in his 46s but lived related to life and his father had stroke in his 67s. He does take an antihistamine over-the-counter Past Medical History:  Diagnosis Date   Allergy    Chickenpox    Diverticulitis    GERD (gastroesophageal reflux disease)     Past Surgical History:  Procedure Laterality Date   TONSILLECTOMY AND ADENOIDECTOMY  1965    Current Medications: Current Meds  Medication Sig   metoprolol succinate (TOPROL XL) 25 MG 24 hr tablet Take 0.5 tablets (12.5 mg total) by mouth daily.     Allergies:   Shingrix [zoster vac recomb adjuvanted]   Social History   Socioeconomic History   Marital status: Divorced    Spouse name: Not on file   Number of children: Not on file   Years of education: Not on file   Highest education level: Not on file  Occupational History   Not on file  Tobacco  Use   Smoking status: Never   Smokeless tobacco: Never  Substance and Sexual Activity   Alcohol use: Yes    Comment: occ   Drug use: Not Currently    Types: Marijuana    Comment: every day    Sexual activity: Not on file  Other Topics Concern   Not on file  Social History Narrative   Not on file   Social Determinants of Health   Financial Resource Strain: Not on file  Food Insecurity: Not on file  Transportation Needs: Not on file  Physical Activity: Not on file  Stress: Not on file  Social Connections: Not on file      Family History: The patient's family history includes Arthritis in his mother; Cancer in his mother; Depression in his paternal grandmother; Early death in his mother and paternal grandmother; Hearing loss in his paternal grandfather; Heart attack in his paternal grandfather and paternal grandmother; Stroke in his father.  ROS:   ROS Please see the history of present illness.     All other systems reviewed and are negative.  EKGs/Labs/Other Studies Reviewed:    The following studies were reviewed today:   Recent Labs: 07/22/2021: ALT 18; BUN 25; Creatinine, Ser 1.23; Hemoglobin 14.4; Platelets 198.0; Potassium 4.3; Sodium 140; TSH 0.90  Recent Lipid Panel    Component Value Date/Time   CHOL 171 07/22/2021 1404   TRIG 61.0 07/22/2021 1404   HDL 55.00 07/22/2021 1404   CHOLHDL 3 07/22/2021 1404   VLDL 12.2 07/22/2021 1404   LDLCALC 103 (H) 07/22/2021 1404    Physical Exam:    VS:  BP (!) 110/58 (BP Location: Left Arm, Patient Position: Sitting, Cuff Size: Normal)   Pulse 82   Ht 5\' 11"  (1.803 m)   Wt 197 lb 0.6 oz (89.4 kg)   BMI 27.48 kg/m     Wt Readings from Last 3 Encounters:  08/21/21 197 lb 0.6 oz (89.4 kg)  07/22/21 197 lb 9.6 oz (89.6 kg)  08/06/19 182 lb (82.6 kg)     GEN: He looks healthy well nourished, well developed in no acute distress HEENT: Normal NECK: No JVD; No carotid bruits LYMPHATICS: No lymphadenopathy CARDIAC: RRR, no murmurs, rubs, gallops he has occasional extrasystoles during the exam RESPIRATORY:  Clear to auscultation without rales, wheezing or rhonchi  ABDOMEN: Soft, non-tender, non-distended MUSCULOSKELETAL:  No edema; No deformity  SKIN: Warm and dry NEUROLOGIC:  Alert and oriented x 3 PSYCHIATRIC:  Normal affect     Signed, 08/08/19, MD  08/21/2021 4:26 PM    [facility]

## 2021-08-21 NOTE — Patient Instructions (Addendum)
Medication Instructions:  Your physician has recommended you make the following change in your medication:   STOP: Zyrtec START: Toprol XL 12.5 mg daily  *If you need a refill on your cardiac medications before your next appointment, please call your pharmacy*   Lab Work: Your physician recommends that you return for lab work in:   Labs today: Magnesium  If you have labs (blood work) drawn today and your tests are completely normal, you will receive your results only by: MyChart Message (if you have MyChart) OR A paper copy in the mail If you have any lab test that is abnormal or we need to change your treatment, we will call you to review the results.   Testing/Procedures: Your physician has requested that you have an echocardiogram. Echocardiography is a painless test that uses sound waves to create images of your heart. It provides your doctor with information about the size and shape of your heart and how well your heart's chambers and valves are working. This procedure takes approximately one hour. There are no restrictions for this procedure.    Follow-Up: At [facility], you and your health needs are our priority.  As part of our continuing mission to provide you with exceptional heart care, we have created designated Provider Care Teams.  These Care Teams include your primary Cardiologist (physician) and Advanced Practice Providers (APPs -  Physician Assistants and Nurse Practitioners) who all work together to provide you with the care you need, when you need it.  We recommend signing up for the patient portal called "MyChart".  Sign up information is provided on this After Visit Summary.  MyChart is used to connect with patients for Virtual Visits (Telemedicine).  Patients are able to view lab/test results, encounter notes, upcoming appointments, etc.  Non-urgent messages can be sent to your provider as well.   To learn more about what you can do with MyChart, go to  [email or web address].    Your next appointment:   6 week(s)  The format for your next appointment:   In Person  Provider:   Gypsy Balsam, MD    Other Instructions None  Important Information About Sugar         1. Avoid all over-the-counter antihistamines except Claritin/Loratadine and Zyrtec/Cetrizine. 2. Avoid all combination including cold sinus allergies flu decongestant and sleep medications 3. You can use Robitussin DM Mucinex and Mucinex DM for cough. 4. can use Tylenol aspirin ibuprofen and naproxen but no combinations such as sleep or sinus.

## 2021-08-25 LAB — MAGNESIUM: Magnesium: 2 mg/dL (ref 1.6–2.3)

## 2021-09-04 ENCOUNTER — Ambulatory Visit (HOSPITAL_BASED_OUTPATIENT_CLINIC_OR_DEPARTMENT_OTHER): Payer: No Typology Code available for payment source

## 2021-10-06 ENCOUNTER — Ambulatory Visit: Payer: No Typology Code available for payment source | Admitting: Cardiology

## 2022-03-03 ENCOUNTER — Encounter (INDEPENDENT_AMBULATORY_CARE_PROVIDER_SITE_OTHER): Payer: No Typology Code available for payment source | Admitting: Family Medicine

## 2022-03-03 DIAGNOSIS — U071 COVID-19: Secondary | ICD-10-CM

## 2022-03-04 ENCOUNTER — Other Ambulatory Visit (HOSPITAL_COMMUNITY): Payer: Self-pay

## 2022-03-04 ENCOUNTER — Other Ambulatory Visit (HOSPITAL_BASED_OUTPATIENT_CLINIC_OR_DEPARTMENT_OTHER): Payer: Self-pay

## 2022-03-04 DIAGNOSIS — U071 COVID-19: Secondary | ICD-10-CM | POA: Diagnosis not present

## 2022-03-04 MED ORDER — NIRMATRELVIR/RITONAVIR (PAXLOVID)TABLET: 3.0000 | ORAL_TABLET | Freq: Two times a day (BID) | ORAL | 0 refills | Status: DC

## 2022-03-04 MED ORDER — NIRMATRELVIR/RITONAVIR (PAXLOVID)TABLET
3.0000 | ORAL_TABLET | Freq: Two times a day (BID) | ORAL | 0 refills | Status: AC
  Filled 2022-03-04: qty 30, 5d supply, fill #0

## 2022-03-04 NOTE — Addendum Note (Signed)
Addended by: Lamar Blinks C on: 03/04/2022 01:44 PM   Modules accepted: Orders

## 2022-03-04 NOTE — Telephone Encounter (Signed)

## 2023-07-10 NOTE — Patient Instructions (Signed)
 It was good to see you today, I will be in touch with your labs

## 2023-07-10 NOTE — Progress Notes (Unsigned)
 [facility] at [facility] [address], [address] [place], [place] [address] [phone]  Date:  07/13/2023   Name:  John Ryan   DOB:  04/10/1960   MRN:  [id number]  PCP:  Watt Harlene BROCKS, MD    Chief Complaint: No chief complaint on file.   History of Present Illness:  John Ryan is a 63 y.o. very pleasant male patient who presents with the following:  Patient seen today for physical exam.  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  He has ophthalmology care  He works as a Warden/ranger  I got a Zio patch for him in 2023 due to very frequent PVCs.  He was noted a PVC burden of 11% so referred him for cardiology visit.  It looks like he saw Dr. 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    Past Medical History:  Diagnosis Date   Allergy    Chickenpox    Diverticulitis    GERD (gastroesophageal reflux disease)     Past Surgical History:  Procedure Laterality Date   TONSILLECTOMY AND ADENOIDECTOMY  1965    Social History   Tobacco Use   Smoking status: Never   Smokeless tobacco: Never  Substance Use Topics   Alcohol use: Yes    Comment: occ   Drug use: Not Currently    Types: Marijuana    Comment: every day     Family History  Problem Relation Age of Onset   Arthritis Mother    Cancer Mother    Early death Mother    Stroke Father    Depression Paternal Grandmother    Early death Paternal Grandmother    Heart attack Paternal Grandmother    Hearing loss Paternal Grandfather    Heart attack Paternal Grandfather     Allergies  Allergen  Reactions   Shingrix  [Zoster Vac Recomb Adjuvanted] Other (See Comments)    Bad reaction- high temp    Medication list has been reviewed and updated.  Current Outpatient Medications on File Prior to Visit  Medication Sig Dispense Refill   metoprolol  succinate (TOPROL  XL) 25 MG 24 hr tablet Take 0.5 tablets (12.5 mg total) by mouth daily. 45 tablet 3   valACYclovir (VALTREX) 1000 MG tablet Take 1,000 mg by mouth daily.     No current facility-administered medications on file prior to visit.    Review of Systems:  As per HPI- otherwise negative.   Physical Examination: There were no vitals filed for this visit. There were no vitals filed for this visit. There is no height or weight on file to calculate BMI. Ideal Body Weight:    GEN: no acute distress. HEENT: Atraumatic, Normocephalic.  Ears and Nose: No external deformity. CV: RRR, No M/G/R. No JVD. No thrill. No extra heart sounds. PULM: CTA B, no wheezes, crackles, rhonchi. No retractions. No resp. distress. No accessory muscle use. ABD: S, NT, ND, +BS. No rebound. No HSM. EXTR: No c/c/e PSYCH: Normally interactive. Conversant.    Assessment and Plan: *** Physical exam today.  Encouraged healthy diet and exercise routine Will plan further follow-  up pending labs.  Signed Harlene Schroeder, MD

## 2023-07-13 ENCOUNTER — Ambulatory Visit (INDEPENDENT_AMBULATORY_CARE_PROVIDER_SITE_OTHER): Payer: BC Managed Care – PPO | Admitting: Family Medicine

## 2023-07-13 ENCOUNTER — Encounter: Payer: Self-pay | Admitting: Family Medicine

## 2023-07-13 ENCOUNTER — Telehealth: Payer: Self-pay | Admitting: Family Medicine

## 2023-07-13 VITALS — BP 120/72 | HR 67 | Temp 98.3°F | Ht 71.0 in | Wt 184.2 lb

## 2023-07-13 DIAGNOSIS — I493 Ventricular premature depolarization: Secondary | ICD-10-CM | POA: Diagnosis not present

## 2023-07-13 DIAGNOSIS — H179 Unspecified corneal scar and opacity: Secondary | ICD-10-CM

## 2023-07-13 DIAGNOSIS — Z13220 Encounter for screening for lipoid disorders: Secondary | ICD-10-CM | POA: Diagnosis not present

## 2023-07-13 DIAGNOSIS — R7303 Prediabetes: Secondary | ICD-10-CM | POA: Diagnosis not present

## 2023-07-13 DIAGNOSIS — Z131 Encounter for screening for diabetes mellitus: Secondary | ICD-10-CM

## 2023-07-13 DIAGNOSIS — Z125 Encounter for screening for malignant neoplasm of prostate: Secondary | ICD-10-CM

## 2023-07-13 DIAGNOSIS — Z0000 Encounter for general adult medical examination without abnormal findings: Secondary | ICD-10-CM

## 2023-07-13 DIAGNOSIS — Z130 Encounter for screening for diseases of the blood and blood-forming organs and certain disorders involving the immune mechanism: Secondary | ICD-10-CM

## 2023-07-13 LAB — COMPREHENSIVE METABOLIC PANEL WITH GFR
ALT: 23 U/L (ref 0–53)
AST: 19 U/L (ref 0–37)
Albumin: 4.6 g/dL (ref 3.5–5.2)
Alkaline Phosphatase: 53 U/L (ref 39–117)
BUN: 20 mg/dL (ref 6–23)
CO2: 29 meq/L (ref 19–32)
Calcium: 9.6 mg/dL (ref 8.4–10.5)
Chloride: 104 meq/L (ref 96–112)
Creatinine, Ser: 1.16 mg/dL (ref 0.40–1.50)
GFR: 66.36 mL/min (ref >60.00)
Glucose, Bld: 97 mg/dL (ref 70–99)
Potassium: 4.1 meq/L (ref 3.5–5.1)
Sodium: 140 meq/L (ref 135–145)
Total Bilirubin: 0.8 mg/dL (ref 0.2–1.2)
Total Protein: 7.2 g/dL (ref 6.0–8.3)

## 2023-07-13 LAB — PSA: PSA: 0.78 ng/mL (ref 0.10–4.00)

## 2023-07-13 LAB — HEMOGLOBIN A1C: Hgb A1c MFr Bld: 5.8 % (ref 4.6–6.5)

## 2023-07-13 LAB — LIPID PANEL
Cholesterol: 183 mg/dL (ref 0–200)
HDL: 57.4 mg/dL (ref >39.00)
LDL Cholesterol: 114 mg/dL — ABNORMAL HIGH (ref 0–99)
NonHDL: 125.67
Total CHOL/HDL Ratio: 3
Triglycerides: 60 mg/dL (ref 0.0–149.0)
VLDL: 12 mg/dL (ref 0.0–40.0)

## 2023-07-13 LAB — CBC
HCT: 45.7 % (ref 39.0–52.0)
Hemoglobin: 15.6 g/dL (ref 13.0–17.0)
MCHC: 34.1 g/dL (ref 30.0–36.0)
MCV: 97.4 fl (ref 78.0–100.0)
Platelets: 188 10*3/uL (ref 150.0–400.0)
RBC: 4.69 Mil/uL (ref 4.22–5.81)
RDW: 12.6 % (ref 11.5–15.5)
WBC: 6.1 10*3/uL (ref 4.0–10.5)

## 2023-07-13 NOTE — Telephone Encounter (Signed)
 Pt was seen today for a cpe. During the check in process he told Dena he has Nurse, learning disability through [employer]. She was not able to process his ins with the number provided so she asked me to help. After trying to process through multiple sources I notified pt I was not able to verify his ins and asked if I could see his A and B number to see if our system would automatically update his correct plan as it usually does. Pt stated I did not need his a and b and that he would call his agent. He then handed the phone to me to speak with his agent. She stated he has [employer] through [facility] I advised I have not been able to process it in our system and asked if she had his A and B card so I that I could at least try to run it that way. She stated that I did not need his a and b card and he did not have to give that info to me. I explained I am just trying different ways to verify his ins and it is always helpful to have his standard medicare number on file regardless of his commercial plan. She stated again that was not correct and he would not be giving me that info. She proceed to state I could call [facility] and verify his ins that way. To that I asked if she was [facility] as I did not understand why speaking to another agent would help. She stated she was [facility] but she could not help me any further. Last thing she said was since we could not verify it today, once he gets a bill from us  they will reimburse him. After that I handed the phone back to the pt and the [facility] rep proceeded to tell the pt he did not need to give me his A and B number under any circumstances. She then proceed to ask the pt if we were going to allow him to leave. He stated he was not sure. I tried to advise the pt we were not holding him here, we just wanted him to be aware there was an ins processing issue. Before I was able to finish he held his hand up in my face and did not let me continue. After he was done speaking to the agent I asked if he was ready to continue  the conversation to which he stated she had already hung up. I advised the pt I did not need to speak to her anymore I just wanted him to be aware we could not process his ins and once he gets a bill from us  he can call [facility] for a reimbursement per the [facility] rep he spoke to.

## 2023-07-27 DIAGNOSIS — H179 Unspecified corneal scar and opacity: Secondary | ICD-10-CM | POA: Diagnosis not present

## 2023-07-27 DIAGNOSIS — H2513 Age-related nuclear cataract, bilateral: Secondary | ICD-10-CM | POA: Diagnosis not present

## 2023-07-27 DIAGNOSIS — H5213 Myopia, bilateral: Secondary | ICD-10-CM | POA: Diagnosis not present

## 2023-07-27 DIAGNOSIS — H43813 Vitreous degeneration, bilateral: Secondary | ICD-10-CM | POA: Diagnosis not present

## 2023-07-30 ENCOUNTER — Encounter: Payer: Self-pay | Admitting: Family Medicine

## 2023-07-30 ENCOUNTER — Other Ambulatory Visit: Payer: Self-pay | Admitting: Family Medicine

## 2023-07-30 DIAGNOSIS — L989 Disorder of the skin and subcutaneous tissue, unspecified: Secondary | ICD-10-CM

## 2023-07-30 NOTE — Progress Notes (Deleted)
 [facility] at [facility] [address] [place], [place] [address] [phone]  Date:  08/01/2023   Name:  John Ryan   DOB:  [birth year]   MRN:  [id number]  PCP:  Watt Harlene BROCKS, MD    Chief Complaint: No chief complaint on file.   History of Present Illness:  John Ryan is a 63 y.o. very pleasant male patient who presents with the following:  Patient seen today with concern about a skin lesion on his head.  Most recent visit with myself was just recently for his physical.  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 Mild prediabes.  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  He was noted a PVC burden of 11% so referred him for cardiology visit.  It looks like he saw Dr. 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    Past Medical History:  Diagnosis Date   Allergy    Chickenpox    Diverticulitis    GERD (gastroesophageal reflux disease)     Past Surgical History:  Procedure Laterality Date   TONSILLECTOMY AND ADENOIDECTOMY  1965    Social History   Tobacco Use   Smoking status: Never   Smokeless tobacco: Never  Substance Use Topics   Alcohol use: Yes    Comment: occ   Drug use: Not Currently    Types: Marijuana    Comment: every day     Family History  Problem Relation Age of Onset   Arthritis Mother    Cancer Mother    Early death Mother    Stroke Father    Depression Paternal Grandmother    Early death Paternal Grandmother    Heart attack Paternal Grandmother    Hearing loss Paternal  Grandfather    Heart attack Paternal Grandfather     Allergies  Allergen Reactions   Shingrix  [Zoster Vac Recomb Adjuvanted] Other (See Comments)    Bad reaction- high temp    Medication list has been reviewed and updated.  Current Outpatient Medications on File Prior to Visit  Medication Sig Dispense Refill   metoprolol  succinate (TOPROL  XL) 25 MG 24 hr tablet Take 0.5 tablets (12.5 mg total) by mouth daily. (Patient not taking: Reported on 07/13/2023) 45 tablet 3   valACYclovir (VALTREX) 1000 MG tablet Take 1,000 mg by mouth daily.     No current facility-administered medications on file prior to visit.    Review of Systems:  ***  Physical Examination: There were no vitals filed for this visit. There were no vitals filed for this visit. There is no height or weight on file to calculate BMI. Ideal Body Weight:    ***  Assessment and Plan: ***  Signed Harlene Watt, MD

## 2023-08-01 ENCOUNTER — Ambulatory Visit: Admitting: Family Medicine

## 2023-09-28 DIAGNOSIS — H2513 Age-related nuclear cataract, bilateral: Secondary | ICD-10-CM | POA: Diagnosis not present

## 2023-09-28 DIAGNOSIS — H179 Unspecified corneal scar and opacity: Secondary | ICD-10-CM | POA: Diagnosis not present

## 2023-11-17 ENCOUNTER — Ambulatory Visit: Admitting: Dermatology

## 2024-02-02 ENCOUNTER — Telehealth: Payer: Self-pay

## 2024-02-02 NOTE — Transitions of Care (Post Inpatient/ED Visit) (Addendum)
" ° °  02/02/2024  Name: Fumio Vandam MRN: [id number] DOB: [birth year]  Today's TOC FU Call Status: Today's TOC FU Call Status:: Unsuccessful Call (1st Attempt) Unsuccessful Call (1st Attempt) Date: 02/02/24  Attempted to reach the patient regarding the most recent Inpatient/ED visit.  Follow Up Plan: Additional outreach attempts will be made to reach the patient to complete the Transitions of Care (Post Inpatient/ED visit) call.   The patient called back. He states that he has not been in the hospital in [place]  and he has had another incident where his name and insurance have been used for medical claim.   Medford Balboa, BSN, RN [place]  VBCI - Population Health RN Care Manager [phone]  "

## 2024-07-23 ENCOUNTER — Encounter: Payer: Self-pay | Admitting: Family Medicine
